# Patient Record
Sex: Male | Born: 1980 | Race: Black or African American | Hispanic: No | Marital: Single | Smoking: Current every day smoker
Health system: Southern US, Community
[De-identification: ages and names within clinical notes are randomized; demographics above are authoritative.]

---

## 2005-04-05 ENCOUNTER — Emergency Department: Payer: Self-pay | Admitting: Unknown Physician Specialty

## 2008-01-15 ENCOUNTER — Emergency Department: Payer: Self-pay | Admitting: Emergency Medicine

## 2008-05-29 ENCOUNTER — Emergency Department (HOSPITAL_COMMUNITY): Admission: EM | Admit: 2008-05-29 | Discharge: 2008-05-29 | Payer: Self-pay | Admitting: Emergency Medicine

## 2008-07-21 ENCOUNTER — Emergency Department: Payer: Self-pay | Admitting: Emergency Medicine

## 2011-04-20 ENCOUNTER — Emergency Department: Payer: Self-pay | Admitting: Emergency Medicine

## 2012-10-06 ENCOUNTER — Emergency Department: Payer: Self-pay | Admitting: Internal Medicine

## 2017-06-18 ENCOUNTER — Encounter (HOSPITAL_COMMUNITY): Payer: Self-pay | Admitting: Emergency Medicine

## 2017-06-18 ENCOUNTER — Emergency Department (HOSPITAL_COMMUNITY)
Admission: EM | Admit: 2017-06-18 | Discharge: 2017-06-18 | Disposition: A | Discharge status: Home / Self Care | Payer: Self-pay | Attending: Emergency Medicine | Admitting: Emergency Medicine

## 2017-06-18 DIAGNOSIS — F1919 Other psychoactive substance abuse with unspecified psychoactive substance-induced disorder: Secondary | ICD-10-CM | POA: Insufficient documentation

## 2017-06-18 DIAGNOSIS — Z7289 Other problems related to lifestyle: Secondary | ICD-10-CM

## 2017-06-18 DIAGNOSIS — Z789 Other specified health status: Secondary | ICD-10-CM

## 2017-06-18 DIAGNOSIS — F1099 Alcohol use, unspecified with unspecified alcohol-induced disorder: Secondary | ICD-10-CM | POA: Insufficient documentation

## 2017-06-18 DIAGNOSIS — F17210 Nicotine dependence, cigarettes, uncomplicated: Secondary | ICD-10-CM | POA: Insufficient documentation

## 2017-06-18 DIAGNOSIS — F1990 Other psychoactive substance use, unspecified, uncomplicated: Secondary | ICD-10-CM

## 2017-06-18 NOTE — ED Triage Notes (Signed)
Pt sent to ED for detox prior to starting ADS class. Pt requesting detox from alcohol, marijuana, and cocaine. Last alcohol use on Saturday. Last cocaine used on Wednesday. Last marijuana use on Saturday. Pt usually drinks 2 forty-ounce beers per day.

## 2017-06-18 NOTE — Discharge Instructions (Signed)
If you have any of the symptoms listed for alcohol withdrawal or have any concerns please return to the emergency room immediately for evaluation.

## 2017-06-18 NOTE — ED Provider Notes (Signed)
[facility]-EMERGENCY DEPT Provider Note   CSN: [id number] Arrival date & time: 06/18/17  1223  By signing my name below, I, Rosana Fretana Waskiewicz, attest that this documentation has been prepared under the direction and in the presence of non-physician practitioner, Lyndel SafeHammond, Elizabeth, PA-C. Electronically Signed: Rosana Fretana Waskiewicz, ED Scribe. 06/18/17. 3:55 PM.  History   Chief Complaint Chief Complaint  Patient presents with  . Detox    Etoh, Cocaine, Marijuana   The history is provided by the patient. No language interpreter was used.   HPI Comments: Randall Foster is a 36 y.o. male who presents to the Emergency Department requesting detox from drugs (marajuanna and cocaine) and alcohol. Pt's last use was 2 days. He reports that he has stopped using alcohol multiple times in the past and has never had any withdrawal symptoms. Pt endorses alcohol use of 6 beers a day for 20 years and mariajuana use everyday for 20 years. Pt states he is no distress at this moment. No abdominal pain, HA, tremors, or any other complaints at this time.  He reports that he is here for medical evaluation as he starts drug class soon, by orders of his parole officer, and needs to be seen for drug withdrawal.   History reviewed. No pertinent past medical history.  There are no active problems to display for this patient.   History reviewed. No pertinent surgical history.     Home Medications    Prior to Admission medications   Not on File    Family History History reviewed. No pertinent family history.  Social History Social History  Substance Use Topics  . Smoking status: Current Every Day Smoker    Packs/day: 1.00    Types: Cigarettes  . Smokeless tobacco: Never Used  . Alcohol use 10.8 oz/week    18 Cans of beer per week     Allergies   Patient has no known allergies.   Review of Systems Review of Systems  Constitutional: Negative for fever.  Gastrointestinal: Negative for abdominal pain.    Neurological: Negative for tremors and headaches.     Physical Exam Updated Vital Signs BP 116/79 (BP Location: Right Arm)   Pulse 82   Temp 98.8 F (37.1 C) (Oral)   Resp 18   Ht 6\' 1"  (1.854 m)   Wt 78.5 kg (173 lb)   SpO2 100%   BMI 22.82 kg/m   Physical Exam  Constitutional: He appears well-developed and well-nourished. No distress.  HENT:  Head: Normocephalic and atraumatic.  Eyes: No scleral icterus.  Neck: Normal range of motion.  Cardiovascular: Normal rate, regular rhythm and intact distal pulses.   Pulmonary/Chest: Effort normal. No respiratory distress.  Abdominal: Soft. He exhibits no distension. There is no tenderness.  Musculoskeletal: Normal range of motion.  Neurological: He displays no seizure activity.  No tremors, normal tone.  Skin: Skin is warm and dry. He is not diaphoretic.  Psychiatric: He has a normal mood and affect.  Nursing note and vitals reviewed.    ED Treatments / Results  DIAGNOSTIC STUDIES: Oxygen Saturation is 99% on RA, normal by my interpretation.   COORDINATION OF CARE: 3:53 PM-Discussed next steps with pt including return precautions. Pt verbalized understanding and is agreeable with the plan.   Labs (all labs ordered are listed, but only abnormal results are displayed) Labs Reviewed - No data to display  EKG  EKG Interpretation None       Radiology No results found.  Procedures Procedures (including critical care  time)  Medications Ordered in ED Medications - No data to display   Initial Impression / Assessment and Plan / ED Course  I have reviewed the triage vital signs and the nursing notes.  Pertinent labs & imaging results that were available during my care of the patient were reviewed by me and considered in my medical decision making (see chart for details).  Patient presents requesting assistance with detox after he was told by his parole officer that he needed to be seen either here or at the  emergency room in [facility] before he could begin taking drug counseling classes. Patient is in no apparent distress, denies any acute symptoms or concerns. Patient reports his substances of abuse are alcohol, cocaine, and marijuana. Patient's last alcoholic drink was over 48 hours ago, and at this time he does not appear to be showing any signs of alcohol withdrawal. Librium prescription was considered and discussed with patient however patient does not feel that it is necessary at this time and I agree. Patient was given a list of alcohol and drug withdrawal signs and symptoms, and strict return precautions. Patient understands that if he does develop alcohol withdrawals he needs treatment as these may progress to seizures which may be fatal.  Patient states his understanding and is agreeable for discharge at this time.   At this time there does not appear to be any evidence of an acute emergency medical condition and the patient appears stable for discharge with appropriate outpatient follow up.Diagnosis was discussed with patient who verbalizes understanding and is agreeable to discharge.    Final Clinical Impressions(s) / ED Diagnoses   Final diagnoses:  Alcohol drinker  Drug use    New Prescriptions There are no discharge medications for this patient.  I personally performed the services described in this documentation, which was scribed in my presence. The recorded information has been reviewed and is accurate.      Cristina Gong, PA-C 06/18/17 2357    Rolland Porter, MD 06/21/17 1041

## 2020-02-24 ENCOUNTER — Encounter (HOSPITAL_COMMUNITY): Payer: Self-pay

## 2020-02-24 ENCOUNTER — Other Ambulatory Visit: Payer: Self-pay

## 2020-02-24 ENCOUNTER — Ambulatory Visit (HOSPITAL_COMMUNITY)
Admission: EM | Admit: 2020-02-24 | Discharge: 2020-02-24 | Disposition: A | Discharge status: Home / Self Care | Payer: Self-pay | Attending: Family Medicine | Admitting: Family Medicine

## 2020-02-24 DIAGNOSIS — S61215A Laceration without foreign body of left ring finger without damage to nail, initial encounter: Secondary | ICD-10-CM

## 2020-02-24 NOTE — ED Triage Notes (Signed)
Pt has a laceration to his left ring finger. Pt states he was cut the hedges at home this morning and he ended up cutting his finger open. This happen this morning.

## 2020-02-24 NOTE — ED Provider Notes (Signed)
[place]    CSN: [id number] Arrival date & time: 02/24/20  1019      History   Chief Complaint Chief Complaint  Patient presents with  . Laceration    HPI Randall Foster is a 39 y.o. male.   HPI  Cut finger while doing yard work this morning  History reviewed. No pertinent past medical history.  There are no problems to display for this patient.   History reviewed. No pertinent surgical history.     Home Medications    Prior to Admission medications   Not on File    Family History History reviewed. No pertinent family history.  Social History Social History   Tobacco Use  . Smoking status: Current Every Day Smoker    Packs/day: 1.00    Types: Cigarettes  . Smokeless tobacco: Never Used  Substance Use Topics  . Alcohol use: Yes    Alcohol/week: 18.0 standard drinks    Types: 18 Cans of beer per week  . Drug use: Yes    Types: Cocaine, Marijuana     Allergies   Patient has no known allergies.   Review of Systems Review of Systems  Skin: Positive for wound.     Physical Exam Triage Vital Signs ED Triage Vitals  Enc Vitals Group     BP 02/24/20 1144 133/86     Pulse Rate 02/24/20 1144 79     Resp 02/24/20 1144 18     Temp 02/24/20 1144 98.1 F (36.7 C)     Temp Source 02/24/20 1144 Oral     SpO2 02/24/20 1144 100 %     Weight 02/24/20 1142 198 lb (89.8 kg)     Height --      Head Circumference --      Peak Flow --      Pain Score 02/24/20 1142 8     Pain Loc --      Pain Edu? --      Excl. in [place]? --    No data found.  Updated Vital Signs BP 133/86 (BP Location: Right Arm)   Pulse 79   Temp 98.1 F (36.7 C) (Oral)   Resp 18   Wt 89.8 kg   SpO2 100%   BMI 26.12 kg/m     Physical Exam Constitutional:      General: He is not in acute distress.    Appearance: He is well-developed.  HENT:     Head: Normocephalic and atraumatic.  Eyes:     Conjunctiva/sclera: Conjunctivae normal.     Pupils: Pupils are  equal, round, and reactive to light.  Cardiovascular:     Rate and Rhythm: Normal rate.  Pulmonary:     Effort: Pulmonary effort is normal. No respiratory distress.  Musculoskeletal:        General: Normal range of motion.     Cervical back: Normal range of motion.  Skin:    General: Skin is warm and dry.     Comments: Tip of the ring finger right hand with C shaped laceration at the nail edge.  Flap appears viable.  Neurological:     Mental Status: He is alert.  Psychiatric:        Mood and Affect: Mood normal.        Behavior: Behavior normal.    Area cleansed with betadine Digital block with 2 % lidocaine Closed with #4 interrupted sutures Flap is pale Wound care discussed  UC Treatments / Results  Labs (  all labs ordered are listed, but only abnormal results are displayed) Labs Reviewed - No data to display  EKG   Radiology No results found.  Procedures Procedures (including critical care time)  Medications Ordered in UC Medications - No data to display  Initial Impression / Assessment and Plan / UC Course  I have reviewed the triage vital signs and the nursing notes.  Pertinent labs & imaging results that were available during my care of the patient were reviewed by me and considered in my medical decision making (see chart for details).     Final Clinical Impressions(s) / UC Diagnoses   Final diagnoses:  Laceration of left ring finger without foreign body without damage to nail, initial encounter     Discharge Instructions     Watch for infection Keep clean Remove stitches at 7 days    ED Prescriptions    None     PDMP not reviewed this encounter.   Eustace Moore, MD 02/24/20 1350

## 2020-02-24 NOTE — Discharge Instructions (Addendum)
Watch for infection Keep clean Remove stitches at 7 days

## 2021-04-10 ENCOUNTER — Emergency Department (HOSPITAL_COMMUNITY)
Admission: EM | Admit: 2021-04-10 | Discharge: 2021-04-10 | Disposition: A | Discharge status: Home / Self Care | Payer: Self-pay | Attending: Emergency Medicine | Admitting: Emergency Medicine

## 2021-04-10 ENCOUNTER — Encounter (HOSPITAL_COMMUNITY): Payer: Self-pay

## 2021-04-10 ENCOUNTER — Other Ambulatory Visit: Payer: Self-pay

## 2021-04-10 ENCOUNTER — Emergency Department (HOSPITAL_COMMUNITY): Payer: Self-pay

## 2021-04-10 DIAGNOSIS — X501XXA Overexertion from prolonged static or awkward postures, initial encounter: Secondary | ICD-10-CM | POA: Insufficient documentation

## 2021-04-10 DIAGNOSIS — S8255XA Nondisplaced fracture of medial malleolus of left tibia, initial encounter for closed fracture: Secondary | ICD-10-CM | POA: Insufficient documentation

## 2021-04-10 DIAGNOSIS — F17210 Nicotine dependence, cigarettes, uncomplicated: Secondary | ICD-10-CM | POA: Insufficient documentation

## 2021-04-10 DIAGNOSIS — S82392A Other fracture of lower end of left tibia, initial encounter for closed fracture: Secondary | ICD-10-CM

## 2021-04-10 NOTE — ED Provider Notes (Signed)
[facility]-EMERGENCY DEPT Provider Note   CSN: [id number] Arrival date & time: 04/10/21  4854     History Chief Complaint  Patient presents with  . Ankle Injury    Randall Foster is a 40 y.o. male.  HPI Patient is a 40 year old male who presents to the emergency department due to sudden onset traumatic left ankle pain that occurred last night.  Patient states he was at a friend's house on a muddy hill and slipped causing him to invert his left ankle and slide down the hill.  He reports immediate pain in the region as well as gradual onset swelling.  Pain worsens with ambulation as well as bearing weight.  States he took 2 Aleve last night with moderate short-term relief of his pain.  Denies any numbness or weakness.  No head trauma or LOC.    History reviewed. No pertinent past medical history.  There are no problems to display for this patient.   History reviewed. No pertinent surgical history.     Family History  Problem Relation Age of Onset  . Cancer Mother     Social History   Tobacco Use  . Smoking status: Current Every Day Smoker    Packs/day: 0.50    Types: Cigarettes  . Smokeless tobacco: Never Used  Vaping Use  . Vaping Use: Never used  Substance Use Topics  . Alcohol use: Yes    Alcohol/week: 18.0 standard drinks    Types: 18 Cans of beer per week  . Drug use: Yes    Types: Marijuana    Home Medications Prior to Admission medications   Not on File    Allergies    Patient has no known allergies.  Review of Systems   Review of Systems  Musculoskeletal: Positive for arthralgias and joint swelling.  Skin: Negative for color change and wound.  Neurological: Negative for weakness and numbness.   Physical Exam Updated Vital Signs BP 130/81 (BP Location: Left Arm)   Pulse 88   Temp 98.4 F (36.9 C) (Oral)   Resp 16   Ht 6' (1.829 m)   Wt 88.9 kg   SpO2 100%   BMI 26.58 kg/m   Physical Exam Vitals and nursing note  reviewed.  Constitutional:      General: He is not in acute distress.    Appearance: He is well-developed.  HENT:     Head: Normocephalic and atraumatic.     Right Ear: External ear normal.     Left Ear: External ear normal.  Eyes:     General: No scleral icterus.       Right eye: No discharge.        Left eye: No discharge.     Conjunctiva/sclera: Conjunctivae normal.  Neck:     Trachea: No tracheal deviation.  Cardiovascular:     Rate and Rhythm: Normal rate.  Pulmonary:     Effort: Pulmonary effort is normal. No respiratory distress.     Breath sounds: No stridor.  Abdominal:     General: There is no distension.  Musculoskeletal:        General: Swelling and tenderness present. No deformity. Normal range of motion.     Cervical back: Neck supple.     Comments: Left leg: Moderate swelling noted to the medial and lateral malleoli of the left ankle.  Moderate tenderness noted in the prior mentioned regions.  Unable to assess range of motion of the ankle due to patient's pain.  Able to wiggle the toes of the left foot without difficulty.  Distal sensation intact.  2+ DP pulses noted.  Negative Thompson's test.  Achilles tendon appears intact.  No tenderness appreciated overlying the proximal tibia or fibula.  Full range of motion of the left knee.  Skin:    General: Skin is warm and dry.     Findings: No rash.  Neurological:     General: No focal deficit present.     Mental Status: He is alert and oriented to person, place, and time.     Cranial Nerves: Cranial nerve deficit: no gross deficits.    ED Results / Procedures / Treatments   Labs (all labs ordered are listed, but only abnormal results are displayed) Labs Reviewed - No data to display  EKG None  Radiology DG Ankle Complete Left  Result Date: 04/10/2021 CLINICAL DATA:  Posttraumatic left ankle injury. EXAM: LEFT ANKLE COMPLETE - 3+ VIEW COMPARISON:  None. FINDINGS: Suspicious lucency through the posterior  malleolus on the lateral view. Generalized soft tissue swelling. No subluxation. IMPRESSION: 1. Suspect a nondisplaced posterior malleolus fracture. Recommend orthopedic follow-up. 2. Generalized soft tissue swelling. Electronically Signed   By: Marnee Spring M.D.   On: 04/10/2021 10:04    Procedures Procedures   Medications Ordered in ED Medications - No data to display  ED Course  I have reviewed the triage vital signs and the nursing notes.  Pertinent labs & imaging results that were available during my care of the patient were reviewed by me and considered in my medical decision making (see chart for details).    MDM Rules/Calculators/A&P                          Patient is a 40 year old male who presents to the emergency department due to traumatic left ankle pain that occurred last night.  X-rays were obtained in triage which show a suspected nondisplaced posterior malleolus fracture.  They recommend orthopedic follow-up.  Physical exam is significant for tenderness and swelling to the bilateral malleoli of the left ankle.  Neurovascularly intact distal to the injury.  No pain noted proximally along the tibia or fibula.  Negative Thompson's test.  Patient placed in a posterior splint and given crutches.  Recommended Tylenol/ibuprofen for pain.  We discussed dosing.  Gave patient orthopedic follow-up and recommended that he follow-up with them next week after the holiday so that he can be reevaluated.  He knows that if his symptoms worsen over the weekend that he needs to come back to the emergency department for reevaluation.  Feel the patient is stable for discharge at this time and he is agreeable.  His questions were answered and he was amicable at the time of discharge.  Final Clinical Impression(s) / ED Diagnoses Final diagnoses:  Closed traumatic nondisplaced fracture of posterior malleolus of left tibia, initial encounter   Rx / DC Orders ED Discharge Orders    None        Placido Sou, PA-C 04/10/21 1049    Rolan Bucco, MD 04/10/21 1233

## 2021-04-10 NOTE — Progress Notes (Signed)
Orthopedic Tech Progress Note Patient Details:  Randall Foster 08-23-1981 [id number]  Ortho Devices Type of Ortho Device: Ace wrap,Post (short leg) splint,Crutches Ortho Device/Splint Location: left Ortho Device/Splint Interventions: Application   Post Interventions Patient Tolerated: Well Instructions Provided: Care of device   [name] 04/10/2021, 11:15 AM

## 2021-04-10 NOTE — ED Notes (Signed)
Ortho tech at bedside to place splint and give patient crutches.

## 2021-04-10 NOTE — ED Triage Notes (Signed)
Patient reports that he slid down a hill and heard his left ankle pop last night,. Patient also has swelling.

## 2021-04-10 NOTE — Discharge Instructions (Addendum)
I recommend a combination of tylenol and ibuprofen for management of your pain. You can take a low dose of both at the same time. I recommend 500 mg of Tylenol combined with 600 mg of ibuprofen. This is one maximum strength Tylenol and three regular ibuprofen. You can take these 2-3 times for day for your pain. Please try to take these medications with a small amount of food as well to prevent upsetting your stomach.  Please elevate and apply ice to the left lower leg to help prevent pain and swelling.  Please continue to use your crutches and keep your splint in place for the next few days.  Below is the contact information for a local orthopedic doctor (Dr. Yevette Edwards).  Please give them a call and schedule a follow-up appointment to have your ankle reexamined.  If you develop any new or worsening symptoms please come back to the emergency department for reevaluation.  It was a pleasure to meet you.

## 2021-04-13 ENCOUNTER — Other Ambulatory Visit: Payer: Self-pay

## 2021-04-13 ENCOUNTER — Ambulatory Visit: Payer: Self-pay

## 2021-04-13 ENCOUNTER — Ambulatory Visit (INDEPENDENT_AMBULATORY_CARE_PROVIDER_SITE_OTHER): Payer: Self-pay | Admitting: Family Medicine

## 2021-04-13 VITALS — BP 110/72 | Ht 72.0 in | Wt 196.0 lb

## 2021-04-13 DIAGNOSIS — S82392D Other fracture of lower end of left tibia, subsequent encounter for closed fracture with routine healing: Secondary | ICD-10-CM

## 2021-04-13 NOTE — Patient Instructions (Addendum)
You have a severe ankle sprain, possibly a very subtle posterior malleolus fracture. Ice the area for 15 minutes at a time, 3-4 times a day Aleve 2 tabs twice a day with food OR ibuprofen 3 tabs three times a day with food for pain and inflammation as needed. Ok to take tylenol with this. Elevate above the level of your heart when possible Crutches to help with walking - I would recommend only touch down weight bearing for now. Wear boot when you're going to moving around (you may have to use the splint until you can get a boot or until I see you back). If not improving as expected, we may repeat x-rays or consider further testing like an MRI. Follow up in 2 weeks for reevaluation. The boots cost 45 dollars on [place] (search 'Cam walker' and get the right one for your shoe size)

## 2021-04-14 ENCOUNTER — Encounter: Payer: Self-pay | Admitting: Family Medicine

## 2021-04-14 NOTE — Progress Notes (Signed)
PCP: Pcp, No  Subjective:   HPI: Patient is a 40 y.o. male here for left ankle injury.  Patient reports he was on a muddy hill Sunday when he slid down this causing him to sit down onto inverted left ankle. Not a lot of pain initially and could bear weight initially. Pain, swelling, bruising worsened after this though. Taking tylenol, ibuprofen which help. Radiographs in ED showed possible posterior malleolar fracture - placed in posterior splint with crutches. No prior ankle injuries.  History reviewed. No pertinent past medical history.  No current outpatient medications on file prior to visit.   No current facility-administered medications on file prior to visit.    History reviewed. No pertinent surgical history.  No Known Allergies  Social History   Socioeconomic History  . Marital status: Single    Spouse name: Not on file  . Number of children: Not on file  . Years of education: Not on file  . Highest education level: Not on file  Occupational History  . Not on file  Tobacco Use  . Smoking status: Current Every Day Smoker    Packs/day: 0.50    Types: Cigarettes  . Smokeless tobacco: Never Used  Vaping Use  . Vaping Use: Never used  Substance and Sexual Activity  . Alcohol use: Yes    Alcohol/week: 18.0 standard drinks    Types: 18 Cans of beer per week  . Drug use: Yes    Types: Marijuana  . Sexual activity: Not on file  Other Topics Concern  . Not on file  Social History Narrative  . Not on file   Social Determinants of Health   Financial Resource Strain: Not on file  Food Insecurity: Not on file  Transportation Needs: Not on file  Physical Activity: Not on file  Stress: Not on file  Social Connections: Not on file  Intimate Partner Violence: Not on file    Family History  Problem Relation Age of Onset  . Cancer Mother     BP 110/72   Ht 6' (1.829 m)   Wt 196 lb (88.9 kg)   BMI 26.58 kg/m   No flowsheet data found.  No flowsheet  data found.  Review of Systems: See HPI above.     Objective:  Physical Exam:  Gen: NAD, comfortable in exam room  Left ankle: Mod swelling, bruising throughout ankle medially and laterally. Mod limitation all directions but able to do so. TTP over anterior ankle joint.  No base 5th, navicular, malleolar tenderness.  Soft tissue tenderness throughout. Trace ant drawer and talar tilt with guarding.   Negative syndesmotic compression. Thompsons test negative. NV intact distally.  Limited MSK u/s left ankle: No cortical irregularity of distal tibia or fibula, base 5th.  Medial and lateral ankle tendons intact.  Achilles intact.  No obvious irregularity of posterior malleolus.  ATFL appears intact.  Mod ankle effusion.   Assessment & Plan:  1. Left ankle injury - independently reviewed radiographs and no obvious fracture.  Ultrasound reassuring aside from ankle effusion.  Either due to severe ankle sprain (sparing ATFL) or subtle posterior malleolar fracture.  Posterior splint until he can get a cam walker (unfortunately does not have insurance).  Touch down weight bearing only.  Icing, aleve or ibuprofen with tylenol, elevation.  F/u in 2 weeks.

## 2021-04-27 ENCOUNTER — Other Ambulatory Visit: Payer: Self-pay

## 2021-04-27 ENCOUNTER — Ambulatory Visit (INDEPENDENT_AMBULATORY_CARE_PROVIDER_SITE_OTHER): Payer: Self-pay | Admitting: Family Medicine

## 2021-04-27 ENCOUNTER — Encounter: Payer: Self-pay | Admitting: Family Medicine

## 2021-04-27 DIAGNOSIS — S93402D Sprain of unspecified ligament of left ankle, subsequent encounter: Secondary | ICD-10-CM

## 2021-04-27 DIAGNOSIS — S93402A Sprain of unspecified ligament of left ankle, initial encounter: Secondary | ICD-10-CM | POA: Insufficient documentation

## 2021-04-27 NOTE — Patient Instructions (Signed)
You're doing great! Focus on motion exercises a couple times a day. Keep boot with you for the next week just in case you need it (ok to wear when at work to be on the safe side too). Icing, voltaren gel, tylenol if needed only. Follow up with me as needed.

## 2021-04-27 NOTE — Assessment & Plan Note (Addendum)
Pt improving following inversion injury to the left ankle. Recommended that pt can wear the boot as needed. Continue ROM exercises. Provided work note to return to work. Follow up as needed.

## 2021-04-27 NOTE — Progress Notes (Addendum)
    SUBJECTIVE:   CHIEF COMPLAINT / HPI:   Randall Foster is a 40 yr old male who presents for follow up  Inversion ankle injury Pt presents for follow up for left ankle fracture. He slipped 2 weeks ago on a muddy hill and had an inversion injury. Found to have possible nondisplaced posterior malleolus fracture when seen in the ED. Placed in boot. He has been using the boot until yesterday where he felt ok to not wear it. He was able ambulate without the boot yesterday and wear his normal shoes with minimal pain. Overall symptoms are improving. He would like to return to work.  PERTINENT  PMH / PSH: none   OBJECTIVE:   BP 110/82   Ht 6' (1.829 m)   Wt 196 lb (88.9 kg)   BMI 26.58 kg/m    Ankle: - Inspection: No obvious deformities, erythematous,ecchymosis, ulcers, calluses, blisters, mild swelling present  - Palpation: No TTP at MT heads, no TTP at base of 5th MT, no TTP over cuboid, no tenderness over navicular prominence,  TTP over lateral malleolus mildly.  No sign of peroneal tendon subluxation or TTP. - Strength: Normal strength with dorsiflexion, plantarflexion, inversion, and eversion of foot; flexion and extension of toes - ROM: Full ROM - Neuro/vasc: NV intact - Special test: thompson's negative.  Trace ant drawer and talar tilt.   ASSESSMENT/PLAN:   Inversion sprain of left ankle Pt improving following inversion injury to the left ankle. Recommended that pt can wear the boot as needed. Continue ROM exercises. Provided work note to return to work. Follow up as needed.      Towanda Octave, MD [facility]

## 2022-01-15 ENCOUNTER — Emergency Department (HOSPITAL_COMMUNITY)
Admission: EM | Admit: 2022-01-15 | Discharge: 2022-01-15 | Disposition: A | Discharge status: Home / Self Care | Payer: Self-pay | Attending: Emergency Medicine | Admitting: Emergency Medicine

## 2022-01-15 ENCOUNTER — Emergency Department (HOSPITAL_COMMUNITY): Payer: Self-pay

## 2022-01-15 ENCOUNTER — Encounter (HOSPITAL_COMMUNITY): Payer: Self-pay | Admitting: Emergency Medicine

## 2022-01-15 ENCOUNTER — Other Ambulatory Visit: Payer: Self-pay

## 2022-01-15 DIAGNOSIS — Z23 Encounter for immunization: Secondary | ICD-10-CM | POA: Insufficient documentation

## 2022-01-15 DIAGNOSIS — S62501A Fracture of unspecified phalanx of right thumb, initial encounter for closed fracture: Secondary | ICD-10-CM | POA: Insufficient documentation

## 2022-01-15 DIAGNOSIS — S32000A Wedge compression fracture of unspecified lumbar vertebra, initial encounter for closed fracture: Secondary | ICD-10-CM | POA: Insufficient documentation

## 2022-01-15 DIAGNOSIS — Y92410 Unspecified street and highway as the place of occurrence of the external cause: Secondary | ICD-10-CM | POA: Diagnosis not present

## 2022-01-15 DIAGNOSIS — S60931A Unspecified superficial injury of right thumb, initial encounter: Secondary | ICD-10-CM | POA: Diagnosis present

## 2022-01-15 MED ORDER — HYDROCODONE-ACETAMINOPHEN 5-325 MG PO TABS
1.0000 | ORAL_TABLET | Freq: Once | ORAL | Status: AC
  Administered 2022-01-15: 1 via ORAL
  Filled 2022-01-15: qty 1

## 2022-01-15 MED ORDER — TETANUS-DIPHTH-ACELL PERTUSSIS 5-2.5-18.5 LF-MCG/0.5 IM SUSY
0.5000 mL | PREFILLED_SYRINGE | Freq: Once | INTRAMUSCULAR | Status: AC
  Administered 2022-01-15: 0.5 mL via INTRAMUSCULAR
  Filled 2022-01-15: qty 0.5

## 2022-01-15 MED ORDER — OXYCODONE-ACETAMINOPHEN 5-325 MG PO TABS: 1.0000 | ORAL_TABLET | Freq: Four times a day (QID) | ORAL | 0 refills | Status: AC | PRN

## 2022-01-15 NOTE — Progress Notes (Signed)
Orthopedic Tech Progress Note ?Patient Details:  ?[name] ?[birth year] ?[id number] ? ?Generously padded plaster thumb spica splint applied to R thumb. Sensation of digit remains intact after application. Pt denies any areas of irritation or tightness from the splint. ? ?Ortho Devices ?Type of Ortho Device: Thumb spica splint ?Splint Material: Plaster ?Ortho Device/Splint Location: RUE ?Ortho Device/Splint Interventions: Ordered, Application, Adjustment ?  ?Post Interventions ?Patient Tolerated: Well ?Instructions Provided: Care of device, Adjustment of device ? ?Tiann Saha Carmine Savoy ?01/15/2022, 12:49 PM ? ?

## 2022-01-15 NOTE — ED Notes (Signed)
Ortho tech applied spica to thumb ?

## 2022-01-15 NOTE — Discharge Instructions (Signed)
Return for any problem.  ? ?Follow up with DR. ORTMANN (Hand) for treatment of your right thumb fracture.  ? ?Follow up with [facility] for treatment of the compression fractures in your lumbar spine.  ?
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
## 2022-01-15 NOTE — Progress Notes (Signed)
Orthopedic Tech Progress Note ?Patient Details:  ?CASY KUSNER ?03-25-1981 ?[id number] ? ? ?Ortho Devices ?Type of Ortho Device: Sling immobilizer ?Splint Material: Plaster ?Ortho Device/Splint Location: RUE ?Ortho Device/Splint Interventions: Ordered, Application, Adjustment ?  ?Post Interventions ?Patient Tolerated: Well ?Instructions Provided: Care of device, Adjustment of device ? ?Zackry Deines Jeri Modena ?01/15/2022, 2:45 PM ? ?

## 2022-01-15 NOTE — ED Notes (Signed)
Ortho tech applied shoulder sling. Pt verbalized understanding of d/c instructions, meds and followup care. Denies questions. VSS, no distress noted. Steady gait to exit with all belongings.  ?

## 2022-05-28 ENCOUNTER — Ambulatory Visit (HOSPITAL_COMMUNITY)
Admission: EM | Admit: 2022-05-28 | Discharge: 2022-05-28 | Disposition: A | Discharge status: Home / Self Care | Payer: Self-pay | Attending: Emergency Medicine | Admitting: Emergency Medicine

## 2022-05-28 ENCOUNTER — Encounter (HOSPITAL_COMMUNITY): Payer: Self-pay

## 2022-05-28 DIAGNOSIS — K1120 Sialoadenitis, unspecified: Secondary | ICD-10-CM

## 2022-05-28 MED ORDER — PILOCARPINE HCL 5 MG PO TABS: 5.0000 mg | ORAL_TABLET | Freq: Two times a day (BID) | ORAL | 0 refills | Status: AC

## 2022-05-28 NOTE — ED Triage Notes (Addendum)
Patient presents to Urgent Care with complaints of salivary gland pain since 4 days ago. Patient reports he has had a stone before so he has been attempting to eat sour things with no succuss and pain is getting worse.   Hard lump noted to right side under mandible. Pain and swelling noted. Pt reports taking motrin for pain at home

## 2022-05-28 NOTE — Discharge Instructions (Signed)
I recommend ibuprofen every 6 hours for inflammation and pain. Try warm compress and massage to the salivary gland.  Suck on candies like lemon drops or any sour candy to help increase saliva production which may help relieve swelling of the gland.  You can also try the Salagen medicine which is a salivary gland stimulant.   Please go to the emergency department if symptoms worsen, especially if you develop severe swelling that impedes swallowing or breathing.

## 2022-05-28 NOTE — ED Provider Notes (Signed)
[facility]    CSN: [id number] Arrival date & time: 05/28/22  1306     History   Chief Complaint Chief Complaint  Patient presents with   salivary gland    HPI Randall Foster is a 41 y.o. male.  Presents with 4 day history of right lower salivary gland swelling and pain. Has tried occasional ibuprofen, has tried lemons and limes. Pain worse when eating. Reports swelling occurs worst when eating. Denies any swelling of the tongue, lips, or throat. Able to tolerate oral secretions, no trouble breathing. No fevers.  History of salivary stone in the past.  History reviewed. No pertinent past medical history.  Patient Active Problem List   Diagnosis Date Noted   Inversion sprain of left ankle 04/27/2021    History reviewed. No pertinent surgical history.   Home Medications    Prior to Admission medications   Medication Sig Start Date End Date Taking? Authorizing Provider  pilocarpine (SALAGEN) 5 MG tablet Take 1 tablet (5 mg total) by mouth 2 (two) times daily. 05/28/22  Yes Keeshawn Fakhouri, Lurena Joiner, PA-C  oxyCODONE-acetaminophen (PERCOCET/ROXICET) 5-325 MG tablet Take 1 tablet by mouth every 6 (six) hours as needed for severe pain. 01/15/22   Wynetta Fines, MD    Family History Family History  Problem Relation Age of Onset   Cancer Mother     Social History Social History   Tobacco Use   Smoking status: Every Day    Packs/day: 0.50    Types: Cigarettes   Smokeless tobacco: Never  Vaping Use   Vaping Use: Never used  Substance Use Topics   Alcohol use: Yes    Alcohol/week: 18.0 standard drinks of alcohol    Types: 18 Cans of beer per week    Comment: every other day 6-8 beers   Drug use: Yes    Types: Marijuana    Comment: every other day     Allergies   Patient has no known allergies.   Review of Systems Review of Systems Per HPI  Physical Exam Triage Vital Signs ED Triage Vitals  Enc Vitals Group     BP 05/28/22 1351 128/80     Pulse  Rate 05/28/22 1351 70     Resp 05/28/22 1351 18     Temp 05/28/22 1351 98 F (36.7 C)     Temp src --      SpO2 05/28/22 1351 95 %     Weight --      Height --      Head Circumference --      Peak Flow --      Pain Score 05/28/22 1349 7     Pain Loc --      Pain Edu? --      Excl. in GC? --    No data found.  Updated Vital Signs BP 128/80   Pulse 70   Temp 98 F (36.7 C)   Resp 18   SpO2 95%     Physical Exam Vitals and nursing note reviewed.  Constitutional:      Appearance: Normal appearance.  HENT:     Head:     Jaw: No malocclusion.     Salivary Glands: Right salivary gland is diffusely enlarged.      Comments: Swelling to right lower neck just below mandible. Tender to touch, not firm. No severe swelling, does not cross midline under chin.    Mouth/Throat:     Mouth: Mucous membranes are moist. No angioedema.  Dentition: No dental tenderness, gingival swelling, dental abscesses or gum lesions.     Pharynx: Oropharynx is clear.     Comments: Airway patent, no swelling noted in the mouth. Glands under the tongue appear normal Eyes:     Conjunctiva/sclera: Conjunctivae normal.  Cardiovascular:     Rate and Rhythm: Normal rate and regular rhythm.     Pulses: Normal pulses.     Heart sounds: Normal heart sounds.  Pulmonary:     Effort: Pulmonary effort is normal.     Breath sounds: Normal breath sounds.  Musculoskeletal:     Cervical back: Full passive range of motion without pain and normal range of motion.  Skin:    General: Skin is warm and dry.  Neurological:     Mental Status: He is alert and oriented to person, place, and time.     UC Treatments / Results  Labs (all labs ordered are listed, but only abnormal results are displayed) Labs Reviewed - No data to display  EKG   Radiology No results found.  Procedures Procedures   Medications Ordered in UC Medications - No data to display  Initial Impression / Assessment and Plan / UC  Course  I have reviewed the triage vital signs and the nursing notes.  Pertinent labs & imaging results that were available during my care of the patient were reviewed by me and considered in my medical decision making (see chart for details).  He will try care at home with massage, warm compress, sialagogues like sour candy. Also prescribed Salagen to use for gland stimulation. Additionally recommend ibuprofen every 6 hours for pain control. Strict return precautions, discussed emergency department evaluation if swelling worsens or begins to occlude throat, if he cannot tolerate secretions, or if he develops trouble breathing. Patient agrees to plan and is discharged in stable condition.  Final Clinical Impressions(s) / UC Diagnoses   Final diagnoses:  Sialoadenitis     Discharge Instructions      I recommend ibuprofen every 6 hours for inflammation and pain. Try warm compress and massage to the salivary gland.  Suck on candies like lemon drops or any sour candy to help increase saliva production which may help relieve swelling of the gland.  You can also try the Salagen medicine which is a salivary gland stimulant.   Please go to the emergency department if symptoms worsen, especially if you develop severe swelling that impedes swallowing or breathing.    ED Prescriptions     Medication Sig Dispense Auth. Provider   pilocarpine (SALAGEN) 5 MG tablet Take 1 tablet (5 mg total) by mouth 2 (two) times daily. 30 tablet Jillian Pianka, Lurena Joiner, PA-C      PDMP not reviewed this encounter.   Marlow Baars, [place] 05/28/22 1453

## 2022-06-03 ENCOUNTER — Emergency Department (HOSPITAL_COMMUNITY)
Admission: EM | Admit: 2022-06-03 | Discharge: 2022-06-03 | Disposition: A | Discharge status: Home / Self Care | Payer: Self-pay | Attending: Emergency Medicine | Admitting: Emergency Medicine

## 2022-06-03 ENCOUNTER — Emergency Department (HOSPITAL_COMMUNITY): Payer: Self-pay

## 2022-06-03 ENCOUNTER — Encounter (HOSPITAL_COMMUNITY): Payer: Self-pay | Admitting: Emergency Medicine

## 2022-06-03 DIAGNOSIS — D72829 Elevated white blood cell count, unspecified: Secondary | ICD-10-CM | POA: Insufficient documentation

## 2022-06-03 DIAGNOSIS — K1120 Sialoadenitis, unspecified: Secondary | ICD-10-CM

## 2022-06-03 DIAGNOSIS — R030 Elevated blood-pressure reading, without diagnosis of hypertension: Secondary | ICD-10-CM

## 2022-06-03 DIAGNOSIS — K115 Sialolithiasis: Secondary | ICD-10-CM

## 2022-06-03 LAB — CBC WITH DIFFERENTIAL/PLATELET
Abs Immature Granulocytes: 0.03 10*3/uL (ref 0.00–0.07)
Basophils Absolute: 0 10*3/uL (ref 0.0–0.1)
Basophils Relative: 0 %
Eosinophils Absolute: 0.2 10*3/uL (ref 0.0–0.5)
Eosinophils Relative: 2 %
HCT: 39.6 % (ref 39.0–52.0)
Hemoglobin: 14 g/dL (ref 13.0–17.0)
Immature Granulocytes: 0 %
Lymphocytes Relative: 24 %
Lymphs Abs: 2.6 10*3/uL (ref 0.7–4.0)
MCH: 32.6 pg (ref 26.0–34.0)
MCHC: 35.4 g/dL (ref 30.0–36.0)
MCV: 92.3 fL (ref 80.0–100.0)
Monocytes Absolute: 1.1 10*3/uL — ABNORMAL HIGH (ref 0.1–1.0)
Monocytes Relative: 11 %
Neutro Abs: 6.7 10*3/uL (ref 1.7–7.7)
Neutrophils Relative %: 63 %
Platelets: 224 10*3/uL (ref 150–400)
RBC: 4.29 MIL/uL (ref 4.22–5.81)
RDW: 12.6 % (ref 11.5–15.5)
WBC: 10.7 10*3/uL — ABNORMAL HIGH (ref 4.0–10.5)
nRBC: 0 % (ref 0.0–0.2)

## 2022-06-03 LAB — BASIC METABOLIC PANEL
Anion gap: 10 (ref 5–15)
BUN: 10 mg/dL (ref 6–20)
CO2: 21 mmol/L — ABNORMAL LOW (ref 22–32)
Calcium: 8.9 mg/dL (ref 8.9–10.3)
Chloride: 106 mmol/L (ref 98–111)
Creatinine, Ser: 0.9 mg/dL (ref 0.61–1.24)
GFR, Estimated: >60 mL/min (ref >60)
Glucose, Bld: 83 mg/dL (ref 70–99)
Potassium: 3.7 mmol/L (ref 3.5–5.1)
Sodium: 137 mmol/L (ref 135–145)

## 2022-06-03 MED ORDER — CEPHALEXIN 250 MG PO CAPS
500.0000 mg | ORAL_CAPSULE | Freq: Once | ORAL | Status: AC
  Administered 2022-06-03: 500 mg via ORAL
  Filled 2022-06-03: qty 2

## 2022-06-03 MED ORDER — TRAMADOL HCL 50 MG PO TABS
50.0000 mg | ORAL_TABLET | Freq: Once | ORAL | Status: AC
  Administered 2022-06-03: 50 mg via ORAL
  Filled 2022-06-03: qty 1

## 2022-06-03 MED ORDER — CEPHALEXIN 500 MG PO CAPS: 500.0000 mg | ORAL_CAPSULE | Freq: Four times a day (QID) | ORAL | 0 refills | Status: AC

## 2022-06-03 MED ORDER — TRAMADOL HCL 50 MG PO TABS: 50.0000 mg | ORAL_TABLET | Freq: Four times a day (QID) | ORAL | 0 refills | Status: AC | PRN

## 2022-06-03 MED ORDER — IOHEXOL 300 MG/ML  SOLN
75.0000 mL | Freq: Once | INTRAMUSCULAR | Status: AC | PRN
  Administered 2022-06-03: 75 mL via INTRAVENOUS

## 2022-06-03 NOTE — ED Triage Notes (Signed)
Patient here with complaint of painful swelling inside his mouth, history of salivary stone. PAtient is alert, oriented, ambulatory, and in no apparent distress at this time.

## 2022-06-03 NOTE — ED Notes (Signed)
Patient transported to CT 

## 2022-06-03 NOTE — ED Provider Triage Note (Signed)
Emergency Medicine Provider Triage Evaluation Note  VIDUR KNUST , a 41 y.o. male  was evaluated in triage.  Pt complains of right side cyst vs LN, prior salivary stone, difficulty swallowing. Went to UC last week and given pills to help with saliva, also taking IBU without relief. Swells worse with eating. Review of Systems  Positive: Painful swallowing Negative: fever  Physical Exam  BP 130/83   Pulse 64   Temp 97.6 F (36.4 C) (Oral)   Resp 16   SpO2 93%  Gen:   Awake, no distress   Resp:  Normal effort  MSK:   Moves extremities without difficulty  Other:  Right submandibular swelling and tenderness, no overlying erythema  Medical Decision Making  Medically screening exam initiated at 3:54 PM.  Appropriate orders placed.  Scharlene Gloss was informed that the remainder of the evaluation will be completed by another provider, this initial triage assessment does not replace that evaluation, and the importance of remaining in the ED until their evaluation is complete.     Jeannie Fend, PA-C 06/03/22 1555

## 2022-06-03 NOTE — Discharge Instructions (Signed)
It was our pleasure to provide your ER care today - we hope that you feel better.  Drink plenty of fluids/stay well hydrated. Use sour hard candies.  Warm compresses to sore area.   Take acetaminophen or ibuprofen as need for pain. You may also take ultram as need for pain - no driving for the next 6 hours or when taking ultram.   Take keflex (antibiotic) as prescribed.   Follow up with ENT doctor this coming week - call office Monday AM to arrange appointment.   Follow up with primary care doctor in next few weeks for blood pressure that is mildly high.   Return to ER if worse, new symptoms, high fevers, increased swelling/spreading redness, severe/intractable pain, trouble breathing or swallowing, or other concern.

## 2022-06-03 NOTE — ED Provider Notes (Signed)
[facility] EMERGENCY DEPARTMENT Provider Note   CSN: [id number] Arrival date & time: 06/03/22  1538     History  Chief Complaint  Patient presents with   Cyst    Randall Foster is a 41 y.o. male.  Patient c/o sore, swollen area to right submandibular area in the past 1.5 weeks. Symptoms acute onset, moderate, persistent, non radiating. No trauma to area. Hx salivary stone ?similar. Denies fever or chills. No nausea/vomiting. No trouble breathing or swallowing. Has tried sour drops without relief.     The history is provided by the patient, medical records and a significant other.       Home Medications Prior to Admission medications   Medication Sig Start Date End Date Taking? Authorizing Provider  oxyCODONE-acetaminophen (PERCOCET/ROXICET) 5-325 MG tablet Take 1 tablet by mouth every 6 (six) hours as needed for severe pain. 01/15/22   Wynetta Fines, MD  pilocarpine (SALAGEN) 5 MG tablet Take 1 tablet (5 mg total) by mouth 2 (two) times daily. 05/28/22   Rising, Lurena Joiner, PA-C      Allergies    Patient has no known allergies.    Review of Systems   Review of Systems  Constitutional:  Negative for fever.  HENT:  Negative for dental problem, sore throat and trouble swallowing.   Respiratory:  Negative for shortness of breath.   Genitourinary:  Negative for flank pain.  Neurological:  Negative for headaches.    Physical Exam Updated Vital Signs BP (!) 128/100   Pulse (!) 55   Temp 98.1 F (36.7 C)   Resp 18   SpO2 99%  Physical Exam Vitals and nursing note reviewed.  Constitutional:      Appearance: Normal appearance. He is well-developed.  HENT:     Head: Atraumatic.     Nose: Nose normal.     Mouth/Throat:     Mouth: Mucous membranes are moist.     Pharynx: Oropharynx is clear.  Eyes:     General: No scleral icterus.    Conjunctiva/sclera: Conjunctivae normal.  Neck:     Trachea: No tracheal deviation.     Comments: No stiffness or  rigidity noted. Right submandibular area of induration, ~ 3 by 4 cm. No fluctuance. No trismus. No swelling, pain, or tenderness to floor of mouth or neck. Trachea midline.  Cardiovascular:     Rate and Rhythm: Normal rate.     Pulses: Normal pulses.  Pulmonary:     Effort: Pulmonary effort is normal. No accessory muscle usage or respiratory distress.     Breath sounds: No stridor.  Genitourinary:    Comments: No cva tenderness. Musculoskeletal:        General: No swelling.     Cervical back: Normal range of motion and neck supple. No rigidity.  Lymphadenopathy:     Cervical: No cervical adenopathy.  Skin:    General: Skin is warm and dry.     Findings: No rash.  Neurological:     Mental Status: He is alert.     Comments: Alert, speech clear.   Psychiatric:        Mood and Affect: Mood normal.     ED Results / Procedures / Treatments   Labs (all labs ordered are listed, but only abnormal results are displayed) Results for orders placed or performed during the hospital encounter of 06/03/22  Basic metabolic panel  Result Value Ref Range   Sodium 137 135 - 145 mmol/L   Potassium 3.7  3.5 - 5.1 mmol/L   Chloride 106 98 - 111 mmol/L   CO2 21 (L) 22 - 32 mmol/L   Glucose, Bld 83 70 - 99 mg/dL   BUN 10 6 - 20 mg/dL   Creatinine, Ser 2.83 0.61 - 1.24 mg/dL   Calcium 8.9 8.9 - 15.1 mg/dL   GFR, Estimated >76 >16 mL/min   Anion gap 10 5 - 15  CBC with Differential  Result Value Ref Range   WBC 10.7 (H) 4.0 - 10.5 K/uL   RBC 4.29 4.22 - 5.81 MIL/uL   Hemoglobin 14.0 13.0 - 17.0 g/dL   HCT 07.3 71.0 - 62.6 %   MCV 92.3 80.0 - 100.0 fL   MCH 32.6 26.0 - 34.0 pg   MCHC 35.4 30.0 - 36.0 g/dL   RDW 94.8 54.6 - 27.0 %   Platelets 224 150 - 400 K/uL   nRBC 0.0 0.0 - 0.2 %   Neutrophils Relative % 63 %   Neutro Abs 6.7 1.7 - 7.7 K/uL   Lymphocytes Relative 24 %   Lymphs Abs 2.6 0.7 - 4.0 K/uL   Monocytes Relative 11 %   Monocytes Absolute 1.1 (H) 0.1 - 1.0 K/uL   Eosinophils  Relative 2 %   Eosinophils Absolute 0.2 0.0 - 0.5 K/uL   Basophils Relative 0 %   Basophils Absolute 0.0 0.0 - 0.1 K/uL   Immature Granulocytes 0 %   Abs Immature Granulocytes 0.03 0.00 - 0.07 K/uL      EKG None  Radiology CT Soft Tissue Neck W Contrast  Result Date: 06/03/2022 CLINICAL DATA:  Soft tissue swelling EXAM: CT NECK WITH CONTRAST TECHNIQUE: Multidetector CT imaging of the neck was performed using the standard protocol following the bolus administration of intravenous contrast. RADIATION DOSE REDUCTION: This exam was performed according to the departmental dose-optimization program which includes automated exposure control, adjustment of the mA and/or kV according to patient size and/or use of iterative reconstruction technique. CONTRAST:  69mL OMNIPAQUE IOHEXOL 300 MG/ML  SOLN COMPARISON:  None Available. FINDINGS: PHARYNX AND LARYNX: The nasopharynx, oropharynx and larynx are normal. Visible portions of the oral cavity, tongue base and floor of mouth are normal. Normal epiglottis, vallecula and pyriform sinuses. The larynx is normal. No retropharyngeal abscess, effusion or lymphadenopathy. SALIVARY GLANDS: There are multiple small stones within the enlarged right submandibular gland. There is a 8 mm stone proximal right submandibular duct. Mild inflammatory change surrounding the right submandibular gland. The other salivary glands are normal. THYROID: Normal. LYMPH NODES: Reactive right submandibular lymph nodes. VASCULAR: Major cervical vessels are patent. LIMITED INTRACRANIAL: Normal. VISUALIZED ORBITS: Normal. MASTOIDS AND VISUALIZED PARANASAL SINUSES: No fluid levels or advanced mucosal thickening. No mastoid effusion. SKELETON: No bony spinal canal stenosis. No lytic or blastic lesions. UPPER CHEST: Clear. OTHER: None. IMPRESSION: 1. Acute right submandibular sialadenitis with 8 mm stone in the proximal right submandibular duct. 2. Reactive right submandibular lymphadenopathy.  Electronically Signed   By: Deatra Robinson M.D.   On: 06/03/2022 20:41    Procedures Procedures    Medications Ordered in ED Medications  cephALEXin (KEFLEX) capsule 500 mg (has no administration in time range)  traMADol (ULTRAM) tablet 50 mg (has no administration in time range)  iohexol (OMNIPAQUE) 300 MG/ML solution 75 mL (75 mLs Intravenous Contrast Given 06/03/22 2016)    ED Course/ Medical Decision Making/ A&P  Medical Decision Making Problems Addressed: Elevated blood pressure reading: acute illness or injury Salivary duct stone: acute illness or injury Sialadenitis: acute illness or injury with systemic symptoms  Amount and/or Complexity of Data Reviewed Independent Historian:     Details: s.o., hx External Data Reviewed: notes. Labs: ordered. Decision-making details documented in ED Course. Radiology: ordered and independent interpretation performed. Decision-making details documented in ED Course.  Risk Prescription drug management. Decision regarding hospitalization.   Iv ns. Continuous pulse ox and cardiac monitoring. Labs ordered/sent. Imaging ordered.   Diff dx includes salivary duct stone, salivary gland inflamm/infxn/abscess, etc - dispo decision including potential need for admission if large abscess or severe infxn considered -  will get labs and imaging and reassess.   Reviewed nursing notes and prior charts for additional history. External reports reviewed. Additional history from: s.o.   Cardiac monitor: sinus rhythm, rate 66.  Labs reviewed/interpreted by me - wbc 10.  CT reviewed/interpreted by me - right submandibular ducts stone.   Given symptoms for 9-10 days, tenderness to area, will also tx w abx. Keflex po. Pt has ride, says ibuprofen alone did not control pain, ultram po.   Pt overall is well, not toxic appearing.   Rx for home, continue lemon drops, fluids, and f/u ENT this coming week.  Return precautions  provided.            Final Clinical Impression(s) / ED Diagnoses Final diagnoses:  None    Rx / DC Orders ED Discharge Orders     None         Cathren Laine, MD 06/03/22 2056
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
